# Patient Record
Sex: Male | Born: 1985 | Hispanic: Yes | Marital: Single | Smoking: Current every day smoker
Health system: Southern US, Community
[De-identification: ages and names within clinical notes are randomized; demographics above are authoritative.]

## PROBLEM LIST (undated history)

## (undated) HISTORY — PX: OTHER SURGICAL HISTORY: SHX169

---

## 2014-01-21 ENCOUNTER — Encounter (HOSPITAL_COMMUNITY): Payer: Self-pay | Admitting: Emergency Medicine

## 2014-01-21 ENCOUNTER — Emergency Department (HOSPITAL_COMMUNITY): Payer: Self-pay

## 2014-01-21 ENCOUNTER — Emergency Department (HOSPITAL_COMMUNITY)
Admission: EM | Admit: 2014-01-21 | Discharge: 2014-01-22 | Disposition: A | Discharge status: Home / Self Care | Payer: Self-pay | Attending: Emergency Medicine | Admitting: Emergency Medicine

## 2014-01-21 DIAGNOSIS — R0989 Other specified symptoms and signs involving the circulatory and respiratory systems: Secondary | ICD-10-CM

## 2014-01-21 DIAGNOSIS — F17200 Nicotine dependence, unspecified, uncomplicated: Secondary | ICD-10-CM | POA: Insufficient documentation

## 2014-01-21 DIAGNOSIS — R6889 Other general symptoms and signs: Secondary | ICD-10-CM | POA: Insufficient documentation

## 2014-01-21 NOTE — ED Provider Notes (Signed)
CSN: [id number]     Arrival date & time 01/21/14  2246 History   First MD Initiated Contact with Patient 01/21/14 2315    This chart was scribed for non-physician practitioner working with Loren Racer, MD by Arlan Organ, ED Scribe. This patient was seen in room WTR8/WTR8 and the patient's care was started at 11:57 PM.    Chief Complaint  Patient presents with  . Swallowed Foreign Body   HPI  HPI Comments: Nicholas Butler is a 28 y.o. male who presents to the Emergency Department complaining of swallowed foreign body ingested just prior to arrival. Pt states he opened a beer bottle with his mouth earlier this evening. Denies removing the cap from his mouth after opening the bottle. He states he then laughed and accidentally swallowed the bottle cap. States he can feel the cap in his throat at this time. Pt has eaten soup broth without any difficulty after incident. He denies any drooling or vomiting. He has no pertinent past medical history. No other concerns this visit.  History reviewed. No pertinent past medical history. Past Surgical History  Procedure Laterality Date  . Arm surgery     History reviewed. No pertinent family history. History  Substance Use Topics  . Smoking status: Current Every Day Smoker  . Smokeless tobacco: Not on file  . Alcohol Use: Yes    Review of Systems  Constitutional: Negative for fever and chills.  HENT: Negative for congestion.   Eyes: Negative for redness.  Respiratory: Negative for cough.   Gastrointestinal: Negative for vomiting.  Skin: Negative for rash.  Psychiatric/Behavioral: Negative for confusion.      Allergies  Review of patient's allergies indicates no known allergies.  Home Medications   Prior to Admission medications   Not on File   Triage Vitals: BP 114/60  Pulse 61  Temp(Src) 98 F (36.7 C) (Oral)  Resp 20  SpO2 100%   Physical Exam  Nursing note and vitals reviewed. Constitutional: He is oriented to person,  place, and time. He appears well-developed and well-nourished. No distress.  Nontoxic/nonseptic appearing. Patient in no visible or audible discomfort. Sleeping on initial presentation to exam room.  HENT:  Head: Normocephalic and atraumatic.  Mouth/Throat: Oropharynx is clear and moist. No oropharyngeal exudate.  Oropharynx clear. Patient tolerating secretions without difficulty.  Eyes: Conjunctivae and EOM are normal. No scleral icterus.  Neck: Normal range of motion. Neck supple.  No stridor.  Cardiovascular: Normal rate, regular rhythm and normal heart sounds.   Pulmonary/Chest: Effort normal and breath sounds normal. No stridor. No respiratory distress. He has no wheezes. He has no rales.  No tachypnea, dyspnea, or wheezing. Patient speaking in full sentences.  Abdominal: He exhibits no distension.  Musculoskeletal: Normal range of motion.  Neurological: He is alert and oriented to person, place, and time.  Skin: Skin is warm and dry. No rash noted. He is not diaphoretic. No erythema. No pallor.  Psychiatric: He has a normal mood and affect. His behavior is normal.    ED Course  Procedures (including critical care time)  DIAGNOSTIC STUDIES: Oxygen Saturation is 100% on RA, Normal by my interpretation.    COORDINATION OF CARE: 12:00 AM-Discussed treatment plan with pt at bedside and pt agreed to plan.     Labs Review Labs Reviewed - No data to display  Imaging Review Dg Neck Soft Tissue  01/21/2014   CLINICAL DATA:  Swallowed a cap of a beer bottle by accident, throat pain since  EXAM:  NECK SOFT TISSUES - 1+ VIEW  COMPARISON:  None  FINDINGS: Prevertebral soft tissues normal thickness.  No radiopaque foreign bodies identified from the oral cavity to the mid esophagus.  Upper lungs clear.  Osseous structures unremarkable.  IMPRESSION: No radiopaque foreign body identified.   Electronically Signed   By: Ulyses SouthwardMark  Boles M.D.   On: 01/21/2014 23:39   Dg Chest 1 View  01/21/2014    CLINICAL DATA:  Assess for foreign body. Patient describes accidentally swallowing [place]ona beer bottle cap. Throat pain.  EXAM: CHEST - 1 VIEW  COMPARISON:  None.  FINDINGS: The lungs are well-aerated and clear. There is no evidence of focal opacification, pleural effusion or pneumothorax.  The cardiomediastinal silhouette is within normal limits. No acute osseous abnormalities are seen.  No radiopaque foreign bodies are seen.  IMPRESSION: No radiopaque foreign bodies seen. No acute cardiopulmonary process identified.   Electronically Signed   By: Roanna RaiderJeffery  Chang M.D.   On: 01/21/2014 23:44   Dg Abd 1 View  01/21/2014   CLINICAL DATA:  Assess for foreign body. Patient describes accidentally swallowing [place]ona beer bottle cap.  EXAM: ABDOMEN - 1 VIEW  COMPARISON:  None.  FINDINGS: The visualized bowel gas pattern is unremarkable. Scattered air and stool filled loops of colon are seen; no abnormal dilatation of small bowel loops is seen to suggest small bowel obstruction. No free intra-abdominal air is identified, though evaluation for free air is limited on a single supine view.  No radiopaque foreign bodies are seen.  The visualized osseous structures are within normal limits; the sacroiliac joints are unremarkable in appearance. The visualized lung bases are essentially clear.  IMPRESSION: No radiopaque foreign bodies seen; unremarkable bowel gas pattern. No free intra-abdominal air identified.   Electronically Signed   By: Roanna RaiderJeffery  Chang M.D.   On: 01/21/2014 23:46     EKG Interpretation None      MDM   Final diagnoses:  Foreign body sensation in throat    28 year old male presents to the emergency department for the sensation of a foreign body in his throat. Patient states that he opened a Corona bottle with his mouth and was playing with a metal bottle cap when he laughed and swallowed it. Patient has been able to drink soup broth at home prior to arrival. He denies drooling or inability to  swallow. He further denies shortness of breath and wheezing. Patient protecting his airway. Oropharynx clear. He is tolerating his secretions without difficulty. No stridor. Imaging today shows no evidence of foreign body in the neck, chest, or abdomen. Doubt swallowed foreign body; however, will refer to ENT if foreign body sensation persists. Return precautions provided and patient agreeable to plan with no unaddressed concerns.  I personally performed the services described in this documentation, which was scribed in my presence. The recorded information has been reviewed and is accurate.   Filed Vitals:   01/21/14 2256  BP: 114/60  Pulse: 61  Temp: 98 F (36.7 C)  TempSrc: Oral  Resp: 20  SpO2: 100%     Antony MaduraKelly Shirell Struthers, PA-C 01/22/14 0008

## 2014-01-21 NOTE — ED Notes (Signed)
Pt states he was playing basketball and had a beer cap in his mouth  Pt states when he laughed he swallowed it   Pt states he is having pain in his throat  No acute resp distress noted at this time

## 2014-01-22 NOTE — ED Provider Notes (Signed)
Medical screening examination/treatment/procedure(s) were performed by non-physician practitioner and as supervising physician I was immediately available for consultation/collaboration.   EKG Interpretation None        Loren Racer, MD 01/22/14 [id number]

## 2014-01-22 NOTE — Discharge Instructions (Signed)
Your imaging today showed no bottle cap in your esophagus, stomach, or intestines. Recommend you followup with an ear nose and throat specialist if symptoms persist.  Swallowed Foreign Body, Adult You have swallowed an object (foreign body). Once the foreign body has passed through the food tube (esophagus), which leads from the mouth to the stomach, it will usually continue through the body without problems. This is because the point where the esophagus enters into the stomach is the narrowest place through which the foreign body must pass. Sometimes the foreign body gets stuck. The most common type of foreign body obstruction in adults is food impaction. Many times, bones from fish or meat products may become lodged in the esophagus or injure the throat on the way down. When there is an object that obstructs the esophagus, the most obvious symptoms are pain and the inability to swallow normally. In some cases, foreign bodies that can be life threatening are swallowed. Examples of these are certain medications and illicit drugs. Often in these instances, patients are afraid of telling what they swallowed. However, it is extremely important to tell the emergency caregiver what was swallowed because life-saving treatment may be needed.  X-ray exams may be taken to find the location of the foreign body. However, some objects do not show up well or may be too small to be seen on an X-ray image. If the foreign body is too large or too sharp, it may be too dangerous to allow it to pass on its own. You may need to see a caregiver who specializes in the digestive system (gastroenterologist). In a few cases, a specialist may need to remove the object using a method called "endoscopy". This involves passing a thin, soft, flexible tube into the food pipe to locate and remove the object. Follow up with your primary doctor or the referral you were given by the emergency caregiver. HOME CARE INSTRUCTIONS   If your  caregiver says it is safe for you to eat, then only have liquids and soft foods until your symptoms improve.  Once you are eating normally:  Cut food into small pieces.  Remove small bones from food.  Remove large seeds and pits from fruit.  Chew your food well.  Do not talk, laugh, or engage in physical activity while eating or swallowing. SEEK MEDICAL CARE IF:  You develop worsening shortness of breath, uncontrollable coughing, chest pains or high fever, greater than 102 F (38.9 C).  You are unable to eat or drink or you feel that food is getting stuck in your throat.  You have choking symptoms or cannot stop drooling.  You develop abdominal pain, vomiting (especially of blood), or rectal bleeding. MAKE SURE YOU:   Understand these instructions.  Will watch your condition.  Will get help right away if you are not doing well or get worse. Document Released: 01/31/2010 Document Revised: 11/05/2011 Document Reviewed: 01/31/2010 [facility] Patient Information 2014 [place], [place].

## 2014-03-18 ENCOUNTER — Emergency Department (HOSPITAL_COMMUNITY)
Admission: EM | Admit: 2014-03-18 | Discharge: 2014-03-18 | Disposition: A | Discharge status: Home / Self Care | Payer: Self-pay | Attending: Emergency Medicine | Admitting: Emergency Medicine

## 2014-03-18 ENCOUNTER — Emergency Department (HOSPITAL_COMMUNITY): Payer: Self-pay

## 2014-03-18 ENCOUNTER — Encounter (HOSPITAL_COMMUNITY): Payer: Self-pay | Admitting: Emergency Medicine

## 2014-03-18 DIAGNOSIS — F17200 Nicotine dependence, unspecified, uncomplicated: Secondary | ICD-10-CM | POA: Insufficient documentation

## 2014-03-18 DIAGNOSIS — R1013 Epigastric pain: Secondary | ICD-10-CM | POA: Insufficient documentation

## 2014-03-18 DIAGNOSIS — R1011 Right upper quadrant pain: Secondary | ICD-10-CM | POA: Insufficient documentation

## 2014-03-18 LAB — COMPREHENSIVE METABOLIC PANEL
ALT: 18 U/L (ref 0–53)
AST: 23 U/L (ref 0–37)
Albumin: 3.8 g/dL (ref 3.5–5.2)
Alkaline Phosphatase: 67 U/L (ref 39–117)
Anion gap: 10 (ref 5–15)
BUN: 10 mg/dL (ref 6–23)
CALCIUM: 9.3 mg/dL (ref 8.4–10.5)
CO2: 26 meq/L (ref 19–32)
Chloride: 102 mEq/L (ref 96–112)
Creatinine, Ser: 0.71 mg/dL (ref 0.50–1.35)
GFR calc Af Amer: >90 mL/min (ref >90)
GFR calc non Af Amer: >90 mL/min (ref >90)
Glucose, Bld: 95 mg/dL (ref 70–99)
Potassium: 4.5 mEq/L (ref 3.7–5.3)
Sodium: 138 mEq/L (ref 137–147)
Total Bilirubin: 0.4 mg/dL (ref 0.3–1.2)
Total Protein: 7.1 g/dL (ref 6.0–8.3)

## 2014-03-18 LAB — CBC WITH DIFFERENTIAL/PLATELET
BASOS ABS: 0 10*3/uL (ref 0.0–0.1)
BASOS PCT: 0 % (ref 0–1)
EOS PCT: 2 % (ref 0–5)
Eosinophils Absolute: 0.1 10*3/uL (ref 0.0–0.7)
HEMATOCRIT: 41.6 % (ref 39.0–52.0)
Hemoglobin: 13.7 g/dL (ref 13.0–17.0)
Lymphocytes Relative: 31 % (ref 12–46)
Lymphs Abs: 2.3 10*3/uL (ref 0.7–4.0)
MCH: 30.8 pg (ref 26.0–34.0)
MCHC: 32.9 g/dL (ref 30.0–36.0)
MCV: 93.5 fL (ref 78.0–100.0)
MONO ABS: 0.8 10*3/uL (ref 0.1–1.0)
Monocytes Relative: 11 % (ref 3–12)
Neutro Abs: 4 10*3/uL (ref 1.7–7.7)
Neutrophils Relative %: 56 % (ref 43–77)
Platelets: 194 10*3/uL (ref 150–400)
RBC: 4.45 MIL/uL (ref 4.22–5.81)
RDW: 12.1 % (ref 11.5–15.5)
WBC: 7.3 10*3/uL (ref 4.0–10.5)

## 2014-03-18 LAB — URINALYSIS, ROUTINE W REFLEX MICROSCOPIC
Bilirubin Urine: NEGATIVE
GLUCOSE, UA: NEGATIVE mg/dL
Hgb urine dipstick: NEGATIVE
Ketones, ur: NEGATIVE mg/dL
Leukocytes, UA: NEGATIVE
Nitrite: NEGATIVE
Protein, ur: NEGATIVE mg/dL
Specific Gravity, Urine: 1.012 (ref 1.005–1.030)
UROBILINOGEN UA: 1 mg/dL (ref 0.0–1.0)
pH: 7 (ref 5.0–8.0)

## 2014-03-18 LAB — LIPASE, BLOOD: LIPASE: 21 U/L (ref 11–59)

## 2014-03-18 MED ORDER — OMEPRAZOLE 20 MG PO CPDR: 20.0000 mg | DELAYED_RELEASE_CAPSULE | Freq: Every day | ORAL | Status: AC

## 2014-03-18 MED ORDER — DICYCLOMINE HCL 10 MG/ML IM SOLN
20.0000 mg | Freq: Once | INTRAMUSCULAR | Status: AC
  Administered 2014-03-18: 20 mg via INTRAMUSCULAR
  Filled 2014-03-18: qty 2

## 2014-03-18 MED ORDER — GI COCKTAIL ~~LOC~~
30.0000 mL | Freq: Once | ORAL | Status: AC
  Administered 2014-03-18: 30 mL via ORAL
  Filled 2014-03-18: qty 30

## 2014-03-18 MED ORDER — MORPHINE SULFATE 4 MG/ML IJ SOLN
4.0000 mg | Freq: Once | INTRAMUSCULAR | Status: AC
  Administered 2014-03-18: 4 mg via INTRAVENOUS
  Filled 2014-03-18: qty 1

## 2014-03-18 MED ORDER — ONDANSETRON HCL 4 MG/2ML IJ SOLN
4.0000 mg | Freq: Once | INTRAMUSCULAR | Status: AC
  Administered 2014-03-18: 4 mg via INTRAVENOUS
  Filled 2014-03-18: qty 2

## 2014-03-18 NOTE — ED Notes (Signed)
Pt with abdominal pain x 2 days.  No n/v/d.  No fever.  Pain in mid abdomen.  No trouble with urination.

## 2014-03-18 NOTE — ED Provider Notes (Signed)
CSN: [id number]     Arrival date & time 03/18/14  1459 History   First MD Initiated Contact with Patient 03/18/14 1653     Chief Complaint  Patient presents with  . Abdominal Pain     (Consider location/radiation/quality/duration/timing/severity/associated sxs/prior Treatment) HPI Comments: Patient presents today with a chief complaint of RUQ and epigastric abdominal pain.  He reports that the pain has been intermittent for the past 2 days.  Pain worse after eating, particularly spicy food.  Pain does not radiate.  Patient denies fever, chills, nausea, vomiting, or diarrhea.  Denies chest pain, cough, or SOB.  Denies urinary symptoms.  Denies flank pain.   He has not taken anything for his symptoms prior to arrival.  He denies any prior history of Abdominal Surgeries.  No known history of Gallstones or Pancreatitis.  He does report that he drinks alcohol regularly and that he also eats a lot of spicy food.    Patient is a 28 y.o. male presenting with abdominal pain. The history is provided by the patient.  Abdominal Pain   History reviewed. No pertinent past medical history. Past Surgical History  Procedure Laterality Date  . Arm surgery     History reviewed. No pertinent family history. History  Substance Use Topics  . Smoking status: Current Every Day Smoker  . Smokeless tobacco: Not on file  . Alcohol Use: Yes    Review of Systems  Gastrointestinal: Positive for abdominal pain.  All other systems reviewed and are negative.     Allergies  Review of patient's allergies indicates no known allergies.  Home Medications   Prior to Admission medications   Not on File   BP 126/62  Pulse 72  Temp(Src) 98.3 F (36.8 C) (Oral)  Resp 20  SpO2 99% Physical Exam  Nursing note and vitals reviewed. Constitutional: He appears well-developed and well-nourished.  HENT:  Head: Normocephalic and atraumatic.  Mouth/Throat: Oropharynx is clear and moist.  Neck: Normal range of  motion. Neck supple.  Cardiovascular: Normal rate, regular rhythm and normal heart sounds.   Pulmonary/Chest: Effort normal and breath sounds normal.  Abdominal: Soft. Bowel sounds are normal. He exhibits no distension and no mass. There is tenderness in the right upper quadrant. There is no rebound, no guarding and no tenderness at McBurney's point.  Neurological: He is alert.  Skin: Skin is warm and dry.  Psychiatric: He has a normal mood and affect.    ED Course  Procedures (including critical care time) Labs Review Labs Reviewed  URINALYSIS, ROUTINE W REFLEX MICROSCOPIC - Abnormal; Notable for the following:    APPearance CLOUDY (*)    All other components within normal limits  CBC WITH DIFFERENTIAL  COMPREHENSIVE METABOLIC PANEL  LIPASE, BLOOD    Imaging Review [place] Abdomen Complete  03/18/2014   CLINICAL DATA:  Right upper quadrant pain for 2 days.  EXAM: ULTRASOUND ABDOMEN COMPLETE  COMPARISON:  None.  FINDINGS: Gallbladder:  No gallstones or wall thickening visualized. No sonographic Murphy sign noted. 0.3 cm polyp is incidentally noted.  Common bile duct:  Diameter: 0.3 cm.  Liver:  No focal lesion identified. Within normal limits in parenchymal echogenicity.  IVC:  No abnormality visualized.  Pancreas:  Visualized portion unremarkable.  Spleen:  Size and appearance within normal limits.  Right Kidney:  Length: 12.3 cm. Echogenicity within normal limits. No mass or hydronephrosis visualized.  Left Kidney:  Length: 11.5 cm. Echogenicity within normal limits. No mass or hydronephrosis visualized.  Abdominal aorta:  No aneurysm visualized.  Other findings:  None.  IMPRESSION: Negative for gallstones or acute abnormality. 0.3 cm gallbladder polyp noted.   Electronically Signed   By: Drusilla Kanner M.D.   On: 03/18/2014 19:00     EKG Interpretation None     7:52 PM Patient reports improvement in pain. 8:39 PM Patient reports significant improvement in pain after GI cocktail.  HR  around 50 on monitor.  Patient denies chest pain or SOB. MDM   Final diagnoses:  None  Patient presents with RUQ abdominal pain.  Labs unremarkable.  No acute findings on abdominal ultrasound.  Pain improved after pain medication and GI cocktail.  Patient stable for discharge.  Patient instructed to take Prilosec and given referral to GI.  Return precautions given.      Santiago Glad, PA-C 03/20/14 2319

## 2014-03-18 NOTE — Discharge Instructions (Signed)
Dolor abdominal °(Abdominal Pain) °El dolor puede tener muchas causas. Normalmente la causa del dolor abdominal no es una enfermedad y mejorará sin tratamiento. Frecuentemente puede controlarse y tratarse en casa. Su médico le realizará un examen físico y posiblemente solicite análisis de sangre y radiografías para ayudar a determinar la gravedad de su dolor. Sin embargo, en muchos casos, debe transcurrir más tiempo antes de que se pueda encontrar una causa evidente del dolor. Antes de llegar a ese punto, es posible que su médico no sepa si necesita más pruebas o un tratamiento más profundo. °INSTRUCCIONES PARA EL CUIDADO EN EL HOGAR  °Esté atento al dolor para ver si hay cambios. Las siguientes indicaciones ayudarán a aliviar cualquier molestia que pueda sentir: °· Tome solo medicamentos de venta libre o recetados, según las indicaciones del médico. °· No tome laxantes a menos que se lo haya indicado su médico. °· Pruebe con una dieta líquida absoluta (caldo, té o agua) según se lo indique su médico. Introduzca gradualmente una dieta normal, según su tolerancia. °SOLICITE ATENCIÓN MÉDICA SI: °· Tiene dolor abdominal sin explicación. °· Tiene dolor abdominal relacionado con náuseas o diarrea. °· Tiene dolor cuando orina o defeca. °· Experimenta dolor abdominal que lo despierta de noche. °· Tiene dolor abdominal que empeora o mejora cuando come alimentos. °· Tiene dolor abdominal que empeora cuando come alimentos grasosos. °· Tiene fiebre. °SOLICITE ATENCIÓN MÉDICA DE INMEDIATO SI:  °· El dolor no desaparece en un plazo máximo de 2 horas. °· No deja de (vomitar). °· El dolor se siente solo en partes del abdomen, como el lado derecho o la parte inferior izquierda del abdomen. °· Evacúa materia fecal sanguinolenta o negra, de aspecto alquitranado. °ASEGÚRESE DE QUE: °· Comprende estas instrucciones. °· Controlará su afección. °· Recibirá ayuda de inmediato si no mejora o si empeora. °Document Released: 08/13/2005  Document Revised: 08/18/2013 °Exit[employer]® Patient Information ©2015 [employer]. This information is not intended to replace advice given to you by your health care provider. Make sure you discuss any questions you have with your health care provider. ° °

## 2014-03-18 NOTE — ED Notes (Signed)
US at bedside

## 2014-03-18 NOTE — ED Notes (Signed)
Pt alert and oriented x4. Respirations even and unlabored, bilateral symmetrical rise and fall of chest. Skin warm and dry. In no acute distress. Denies needs.   

## 2014-03-18 NOTE — ED Notes (Signed)
Pt reports pain is coming back. PA made aware

## 2014-03-21 NOTE — ED Provider Notes (Signed)
Medical screening examination/treatment/procedure(s) were performed by non-physician practitioner and as supervising physician I was immediately available for consultation/collaboration.   EKG Interpretation   Date/Time:  Thursday March 18 2014 17:47:58 EDT Ventricular Rate:  37 PR Interval:  188 QRS Duration: 95 QT Interval:  445 QTC Calculation: 349 R Axis:   86 Text Interpretation:  Sinus bradycardia ST elev, probable normal early  repol pattern Confirmed by Wilkie AyeHORTON  MD, COURTNEY ([id number]) on 03/18/2014  5:53:57 PM       Toy BakerAnthony T Wil Slape, MD 03/21/14 1526

## 2014-11-01 ENCOUNTER — Encounter (HOSPITAL_COMMUNITY): Payer: Self-pay | Admitting: Emergency Medicine

## 2014-11-01 ENCOUNTER — Emergency Department (HOSPITAL_COMMUNITY)
Admission: EM | Admit: 2014-11-01 | Discharge: 2014-11-01 | Disposition: A | Discharge status: Home / Self Care | Payer: Self-pay | Attending: Emergency Medicine | Admitting: Emergency Medicine

## 2014-11-01 ENCOUNTER — Emergency Department (HOSPITAL_COMMUNITY): Payer: Self-pay

## 2014-11-01 DIAGNOSIS — Z720 Tobacco use: Secondary | ICD-10-CM | POA: Insufficient documentation

## 2014-11-01 DIAGNOSIS — S46911A Strain of unspecified muscle, fascia and tendon at shoulder and upper arm level, right arm, initial encounter: Secondary | ICD-10-CM

## 2014-11-01 DIAGNOSIS — Y9366 Activity, soccer: Secondary | ICD-10-CM | POA: Insufficient documentation

## 2014-11-01 DIAGNOSIS — Y998 Other external cause status: Secondary | ICD-10-CM | POA: Insufficient documentation

## 2014-11-01 DIAGNOSIS — S56911A Strain of unspecified muscles, fascia and tendons at forearm level, right arm, initial encounter: Secondary | ICD-10-CM | POA: Insufficient documentation

## 2014-11-01 DIAGNOSIS — W1839XA Other fall on same level, initial encounter: Secondary | ICD-10-CM | POA: Insufficient documentation

## 2014-11-01 DIAGNOSIS — Z79899 Other long term (current) drug therapy: Secondary | ICD-10-CM | POA: Insufficient documentation

## 2014-11-01 DIAGNOSIS — Y92322 Soccer field as the place of occurrence of the external cause: Secondary | ICD-10-CM | POA: Insufficient documentation

## 2014-11-01 MED ORDER — NAPROXEN 500 MG PO TABS: 500.0000 mg | ORAL_TABLET | Freq: Two times a day (BID) | ORAL | Status: AC

## 2014-11-01 NOTE — ED Notes (Signed)
Pt fell playing soccer yesterday, caught himself with his wrist, c/o right shoulder pain. Sensation intact.

## 2014-11-01 NOTE — ED Provider Notes (Signed)
CSN: [id number]     Arrival date & time 11/01/14  1812 History  This chart was scribed for non-physician practitioner, Antony MaduraKelly Loraine Freid, PA-C, working with Linwood DibblesJon Knapp, MD, by Ronney LionSuzanne Le, ED Scribe. This patient was seen in room WTR6/WTR6 and the patient's care was started at 8:11 PM.     No chief complaint on file.  The history is provided by the patient. No language interpreter was used.    HPI Comments: Nicholas Butler is a 29 y.o. male who is right-hand dominant who presents to the Emergency Department complaining of a right arm injury that occurred when patient was playing soccer yesterday and fell, catching himself with his right wrist. He complains of associated throbbing pain in his upper right forearm, near his medial elbow. He notes some pain in his right shoulder, but states it is not as severe as in his forearm. Patient states that movement exacerbates the pain. Patient has tried applying heat and has taken ibuprofen, with mild relief. He denies applying ice to the area. Patient works as a Designer, fashion/clothingroofer.  History reviewed. No pertinent past medical history. Past Surgical History  Procedure Laterality Date  . Arm surgery     No family history on file. History  Substance Use Topics  . Smoking status: Current Every Day Smoker  . Smokeless tobacco: Not on file  . Alcohol Use: Yes    Review of Systems  Musculoskeletal: Positive for myalgias and arthralgias.  All other systems reviewed and are negative.   Allergies  Review of patient's allergies indicates no known allergies.  Home Medications   Prior to Admission medications   Medication Sig Start Date End Date Taking? Authorizing Provider  ibuprofen (ADVIL,MOTRIN) 200 MG tablet Take 800 mg by mouth every 6 (six) hours as needed for moderate pain (pain).   Yes Historical Provider, MD  naproxen (NAPROSYN) 500 MG tablet Take 1 tablet (500 mg total) by mouth 2 (two) times daily. 11/01/14   Antony MaduraKelly Katerine Morua, PA-C  omeprazole (PRILOSEC) 20 MG capsule  Take 1 capsule (20 mg total) by mouth daily. Patient not taking: Reported on 11/01/2014 03/18/14   Heather Laisure, PA-C   BP 110/60 mmHg  Pulse 64  Temp(Src) 98.1 F (36.7 C) (Oral)  Resp 16  SpO2 99%   Physical Exam  Constitutional: He is oriented to person, place, and time. He appears well-developed and well-nourished. No distress.  Nontoxic/nonseptic appearing  HENT:  Head: Normocephalic and atraumatic.  Eyes: Conjunctivae and EOM are normal. No scleral icterus.  Neck: Normal range of motion.  Cardiovascular: Normal rate, regular rhythm and intact distal pulses.   Distal radial pulse 2+ in the right upper extremity  Pulmonary/Chest: Effort normal. No respiratory distress.  Musculoskeletal: Normal range of motion.       Right elbow: He exhibits swelling (mild). He exhibits normal range of motion, no deformity and no laceration. Tenderness found.       Arms: TTP to the volar aspect of the proximal R forearm. Mild TTP at site of muscle swelling; most c/w muscle strain of pronator teres. Normal PROM and AROM exhibited.  Neurological: He is alert and oriented to person, place, and time. He exhibits normal muscle tone. Coordination normal.  Sensation to light touch intact. Patient able to wiggle all fingers of right hand. Normal grip strength bilaterally. Strength against resistance 5/5 in all major muscle groups in bilateral upper extremities.  Skin: Skin is warm and dry. No rash noted. He is not diaphoretic. No erythema. No pallor.  Psychiatric: He has a normal mood and affect. His behavior is normal.  Nursing note and vitals reviewed.   ED Course  Procedures (including critical care time)  DIAGNOSTIC STUDIES: Oxygen Saturation is 99% on room air, normal by my interpretation.    COORDINATION OF CARE: 8:15 PM - Discussed negative XR findings with patient. Likely muscle strain. RICE protocol discussed. Will discharge with anti-inflammatory medication for pain. Will also give  orthopedic referral if needed.  Imaging Review Dg Shoulder Right  11/01/2014   CLINICAL DATA:  Injury on 10/30/2014 1 playing soccer. Pain radiating from the elbow to the right shoulder.  EXAM: RIGHT SHOULDER - 2+ VIEW  COMPARISON:  None.  FINDINGS: There is no evidence of fracture or dislocation. There is no evidence of arthropathy or other focal bone abnormality. Soft tissues are unremarkable.  IMPRESSION: Negative.   Electronically Signed   By: Burman Nieves M.D.   On: 11/01/2014 19:57   Dg Elbow Complete Right  11/01/2014   CLINICAL DATA:  Fall on the afternoon of 16109 playing soccer. Pain radiating from the posterior ulna to the right elbow and right shoulder.  EXAM: RIGHT ELBOW - COMPLETE 3+ VIEW  COMPARISON:  None.  FINDINGS: There is no evidence of fracture, dislocation, or joint effusion. There is no evidence of arthropathy or other focal bone abnormality. Soft tissues are unremarkable.  IMPRESSION: Negative.   Electronically Signed   By: Burman Nieves M.D.   On: 11/01/2014 19:56    MDM   Final diagnoses:  Elbow strain, right, initial encounter    29 year old nontoxic-appearing male presents to the emergency department for further evaluation of injury to right upper extremity. Patient is neurovascularly intact. No bony tenderness. Imaging negative for fracture or dislocation. Symptoms consistent with muscle strain/injury. Will manage conservatively with RICE and NSAIDs. Patient given referral to orthopedics should symptoms persist or worsen. Return precautions given and patient agreeable to plan with no unaddressed concerns. Patient discharged in good condition.  I personally performed the services described in this documentation, which was scribed in my presence. The recorded information has been reviewed and is accurate.   Filed Vitals:   11/01/14 1846 11/01/14 2036  BP:  110/60  Pulse: 73 64  Temp: 98.1 F (36.7 C) 98.1 F (36.7 C)  TempSrc: Oral Oral  Resp: 16 16   SpO2: 99% 99%     Antony Madura, PA-C 11/01/14 2218  Linwood Dibbles, MD 11/02/14 [id number]

## 2014-11-01 NOTE — Progress Notes (Signed)
  CARE MANAGEMENT ED NOTE 11/01/2014  Patient:  Nicholas Butler,Nicholas Butler   Account Number:  [id number]  Date Initiated:  11/01/2014  Documentation initiated by:  Radford PaxFERRERO,Jenesys Casseus  Subjective/Objective Assessment:   Patient presents to Ed post fall landing on his wrist     Subjective/Objective Assessment Detail:     Action/Plan:   Action/Plan Detail:   Anticipated DC Date:  11/01/2014     Status Recommendation to Physician:   Result of Recommendation:    Other ED Services  Consult Working Plan    DC Planning Services  Other  PCP issues    Choice offered to / List presented to:            Status of service:  Completed, signed off  ED Comments:   ED Comments Detail:  EDCM spoke to patient and his wife at bedside.  Patient confirms she does not have a pcp or [place].  EDCM provide patient with pamphlet to [facility], informed patient of services there and walk in times. EDCM also provided patient with list of pcps who accept self pay patients, list of discount pharmacies and websites needymeds.org and GoodRX.com for medication assistance, phone number to inquire about the orange card, phone number to inquire about [employer], phone number to inquire about the Affordable Care Act, financial resources in the community such as local churches, [employer], urban ministries, and dental assistance for uninsured patients. Patient thankful for resources.  Patient's wife agreeable to have information sent to the patient in the mail regarding the orange card. [facility] referral made.  No further EDCM needs at this time.

## 2014-11-01 NOTE — Discharge Instructions (Signed)
° ° °Elbow Contusion °An elbow contusion is a deep bruise of the elbow. Contusions are the result of an injury that caused bleeding under the skin. The contusion may turn blue, purple, or yellow. Minor injuries will give you a painless contusion, but more severe contusions may stay painful and swollen for a few weeks.  °CAUSES  °An elbow contusion comes from a direct force to that area, such as falling on the elbow. °SYMPTOMS  °· Swelling and redness of the elbow. °· Bruising of the elbow area. °· Tenderness or soreness of the elbow. °DIAGNOSIS  °You will have a physical exam and will be asked about your history. You may need an X-ray of your elbow to look for a broken bone (fracture).  °TREATMENT  °A sling or splint may be needed to support your injury. Resting, elevating, and applying cold compresses to the elbow area are often the best treatments for an elbow contusion. Over-the-counter medicines may also be recommended for pain control. °HOME CARE INSTRUCTIONS  °· Put ice on the injured area. °· Put ice in a plastic bag. °· Place a towel between your skin and the bag. °· Leave the ice on for 15-20 minutes, 03-04 times a day. °· Only take over-the-counter or prescription medicines for pain, discomfort, or fever as directed by your caregiver. °· Rest your injured elbow until the pain and swelling are better. °· Elevate your elbow to reduce swelling. °· Apply a compression wrap as directed by your caregiver. This can help reduce swelling and motion. You may remove the wrap for sleeping, showers, and baths. If your fingers become numb, cold, or blue, take the wrap off and reapply it more loosely. °· Use your elbow only as directed by your caregiver. You may be asked to do range of motion exercises. Do them as directed. °· See your caregiver as directed. It is very important to keep all follow-up appointments in order to avoid any long-term problems with your elbow, including chronic pain or inability to move your  elbow normally. °SEEK IMMEDIATE MEDICAL CARE IF:  °· You have increased redness, swelling, or pain in your elbow. °· Your swelling or pain is not relieved with medicines. °· You have swelling of the hand and fingers. °· You are unable to move your fingers or wrist. °· You begin to lose feeling in your hand or fingers. °· Your fingers or hand become cold or blue. °MAKE SURE YOU:  °· Understand these instructions. °· Will watch your condition. °· Will get help right away if you are not doing well or get worse. °Document Released: 07/22/2006 Document Revised: 11/05/2011 Document Reviewed: 06/29/2011 °ExitCare® Patient Information ©2015 [employer]. This information is not intended to replace advice given to you by your health care provider. Make sure you discuss any questions you have with your health care provider. ° ° °RICE: Routine Care for Injuries °The routine care of many injuries includes Rest, Ice, Compression, and Elevation (RICE). °HOME CARE INSTRUCTIONS °· Rest is needed to allow your body to heal. Routine activities can usually be resumed when comfortable. Injured tendons and bones can take up to 6 weeks to heal. Tendons are the cord-like structures that attach muscle to bone. °· Ice following an injury helps keep the swelling down and reduces pain. °¨ Put ice in a plastic bag. °¨ Place a towel between your skin and the bag. °¨ Leave the ice on for 15-20 minutes, 3-4 times a day, or as directed by your health care   provider. Do this while awake, for the first 24 to 48 hours. After that, continue as directed by your caregiver. °· Compression helps keep swelling down. It also gives support and helps with discomfort. If an elastic bandage has been applied, it should be removed and reapplied every 3 to 4 hours. It should not be applied tightly, but firmly enough to keep swelling down. Watch fingers or toes for swelling, bluish discoloration, coldness, numbness, or excessive pain. If any of these problems  occur, remove the bandage and reapply loosely. Contact your caregiver if these problems continue. °· Elevation helps reduce swelling and decreases pain. With extremities, such as the arms, hands, legs, and feet, the injured area should be placed near or above the level of the heart, if possible. °SEEK IMMEDIATE MEDICAL CARE IF: °· You have persistent pain and swelling. °· You develop redness, numbness, or unexpected weakness. °· Your symptoms are getting worse rather than improving after several days. °These symptoms may indicate that further evaluation or further X-rays are needed. Sometimes, X-rays may not show a small broken bone (fracture) until 1 week or 10 days later. Make a follow-up appointment with your caregiver. Ask when your X-ray results will be ready. Make sure you get your X-ray results. °Document Released: 11/25/2000 Document Revised: 08/18/2013 Document Reviewed: 01/12/2011 °ExitCare® Patient Information ©2015 [employer]. This information is not intended to replace advice given to you by your health care provider. Make sure you discuss any questions you have with your health care provider. ° ° °

## 2016-01-01 ENCOUNTER — Encounter (HOSPITAL_COMMUNITY): Payer: Self-pay

## 2016-01-01 ENCOUNTER — Emergency Department (HOSPITAL_COMMUNITY): Payer: Self-pay

## 2016-01-01 ENCOUNTER — Emergency Department (HOSPITAL_COMMUNITY)
Admission: EM | Admit: 2016-01-01 | Discharge: 2016-01-02 | Disposition: A | Discharge status: Home / Self Care | Payer: Self-pay | Attending: Emergency Medicine | Admitting: Emergency Medicine

## 2016-01-01 DIAGNOSIS — F17200 Nicotine dependence, unspecified, uncomplicated: Secondary | ICD-10-CM | POA: Insufficient documentation

## 2016-01-01 DIAGNOSIS — X500XXA Overexertion from strenuous movement or load, initial encounter: Secondary | ICD-10-CM | POA: Insufficient documentation

## 2016-01-01 DIAGNOSIS — Y9289 Other specified places as the place of occurrence of the external cause: Secondary | ICD-10-CM | POA: Insufficient documentation

## 2016-01-01 DIAGNOSIS — Z791 Long term (current) use of non-steroidal anti-inflammatories (NSAID): Secondary | ICD-10-CM | POA: Insufficient documentation

## 2016-01-01 DIAGNOSIS — S93402A Sprain of unspecified ligament of left ankle, initial encounter: Secondary | ICD-10-CM | POA: Insufficient documentation

## 2016-01-01 DIAGNOSIS — Y9366 Activity, soccer: Secondary | ICD-10-CM | POA: Insufficient documentation

## 2016-01-01 DIAGNOSIS — Y998 Other external cause status: Secondary | ICD-10-CM | POA: Insufficient documentation

## 2016-01-01 MED ORDER — HYDROCODONE-ACETAMINOPHEN 5-325 MG PO TABS: 1.0000 | ORAL_TABLET | Freq: Four times a day (QID) | ORAL | Status: AC | PRN

## 2016-01-01 MED ORDER — IBUPROFEN 200 MG PO TABS
600.0000 mg | ORAL_TABLET | Freq: Once | ORAL | Status: AC
  Administered 2016-01-02: 600 mg via ORAL
  Filled 2016-01-01: qty 3

## 2016-01-01 MED ORDER — IBUPROFEN 800 MG PO TABS: 800.0000 mg | ORAL_TABLET | Freq: Three times a day (TID) | ORAL | Status: AC

## 2016-01-01 MED ORDER — HYDROCODONE-ACETAMINOPHEN 5-325 MG PO TABS
1.0000 | ORAL_TABLET | Freq: Once | ORAL | Status: AC
  Administered 2016-01-02: 1 via ORAL
  Filled 2016-01-01: qty 1

## 2016-01-01 NOTE — ED Notes (Signed)
Patient c/o left ankle and foot pain.  Patient states that was playing soccer and was kicked on the medial ankle.  Patient states that pain has gotten worse since the incident today and the foot is swelling and painful.  Patient has movement of the toes and palpable pulses.  Patient rates pain 10/10.

## 2016-01-01 NOTE — Discharge Instructions (Signed)
Be sure to read and understand instructions below prior to leaving the hospital. If your symptoms persist without any improvement in 1-2 weeks it is recommended that you follow up with the orthopedist listed above. Use your pain medication as prescribed and do not operate heavy machinery while on pain medication. Note that your pain medication contains acetaminophen (Tylenol) & its is not reccommended that you use additional acetaminophen (Tylenol) while taking this medication.  Ankle Sprain  An ankle sprain is an injury to the ligaments that hold the ankle joint together. Your X-ray today showed no evidence of fracture, however keep all follow-up appointments with an orthopedic specialist to have follow-up X-rays, because fractures may not appear until 3 days after the acute injury.    TREATMENT  Rest, ice, elevation, and compression are the basic modes of treatment.    HOME CARE INSTRUCTIONS  Apply ice to the sore area for 15 to 20 minutes, 3 to 4 times per day. Do this while you are awake for the first 2 days. This can be stopped when the swelling goes away. Put the ice in a plastic bag and place a towel between the bag of ice and your skin.  Keep your leg elevated when possible to lessen swelling.  If your caregiver recommends crutches, use them as instructed for 1 week. Then, you may walk on your ankle weight bearing as tolerated.  You may take off your ankle stabilizer at night and to take a shower or bath. Wiggle your toes in the splint several times per day if you are able.  Do not drive a vehicle on pain medication. ACTIVITY:            - Weight bearing as tolerated            - Exercises should be limited to pain free range of motion            - Can start mobilization by tracing the alphabet with your foot in the air.       SEEK MEDICAL CARE IF:  You have an increase in bruising, swelling, or pain.  Your toes feel cold.  Pain relief is not achieved with medications.  EMERGENCY::  Your toes are numb or blue or you have severe pain.   MAKE SURE YOU:  Understand these instructions.  Will watch your condition.  Will get help right away if you are not doing well or get worse   COLD THERAPY DIRECTIONS:  Ice or gel packs can be used to reduce both pain and swelling. Ice is the most helpful within the first 24 to 48 hours after an injury or flareup from overusing a muscle or joint.  Ice is effective, has very few side effects, and is safe for most people to use.   If you expose your skin to cold temperatures for too long or without the proper protection, you can damage your skin or nerves. Watch for signs of skin damage due to cold.   HOME CARE INSTRUCTIONS  Follow these tips to use ice and cold packs safely.  Place a dry or damp towel between the ice and skin. A damp towel will cool the skin more quickly, so you may need to shorten the time that the ice is used.  For a more rapid response, add gentle compression to the ice.  Ice for no more than 10 to 20 minutes at a time. The bonier the area you are icing, the less time it will take  to get the benefits of ice.  Check your skin after 5 minutes to make sure there are no signs of a poor response to cold or skin damage.  Rest 20 minutes or more in between uses.  Once your skin is numb, you can end your treatment. You can test numbness by very lightly touching your skin. The touch should be so light that you do not see the skin dimple from the pressure of your fingertip. When using ice, most people will feel these normal sensations in this order: cold, burning, aching, and numbness.

## 2016-01-01 NOTE — ED Provider Notes (Signed)
CSN: [id number]     Arrival date & time 01/01/16  2205 History   By signing my name below, I, Nicholas Butler, attest that this documentation has been prepared under the direction and in the presence of [facility] Campus[facility]ime Mikki Ziff PA-C.  Electronically Signed: Arlan OrganAshley Butler, ED Scribe. 01/01/2016. 11:24 PM.   Chief Complaint  Patient presents with  . Foot Injury   The history is provided by the patient. No language interpreter was used.    HPI Comments: Nicholas Butler is a 30 y.o. male with no pertinent past medical history who presents to the Emergency Department complaining of constant, ongoing L ankle pain and L foot pain with associated swelling onset a few hours prior to arrival. Pain is described as throbbing. Pt states he was playing soccer earlier this afternoon when he was kicked in his ankle causing his ankle to invert. No head trauma or LOC. Discomfort to ankle and foot exacerbated with ambulation, movement, and pressure to area. No alleviating factors at this time. No recent fever, chills, nausea, or vomiting. No weakness, loss of sensation, or numbness. No known allergies to medications.  PCP: No PCP Per Patient    History reviewed. No pertinent past medical history. Past Surgical History  Procedure Laterality Date  . Arm surgery     No family history on file. Social History  Substance Use Topics  . Smoking status: Current Every Day Smoker  . Smokeless tobacco: None  . Alcohol Use: Yes     Comment: socially    Review of Systems  Constitutional: Negative for fever.  Musculoskeletal: Positive for joint swelling and arthralgias.  Skin: Negative for color change.  Neurological: Negative for weakness, numbness and headaches.      Allergies  Review of patient's allergies indicates no known allergies.  Home Medications   Prior to Admission medications   Medication Sig Start Date End Date Taking? Authorizing Provider  HYDROcodone-acetaminophen (NORCO/VICODIN) 5-325 MG tablet Take 1 tablet  by mouth every 6 (six) hours as needed for severe pain. 01/01/16   [name] [name], PA-C  ibuprofen (ADVIL,MOTRIN) 800 MG tablet Take 1 tablet (800 mg total) by mouth 3 (three) times daily. 01/01/16   [name], PA-C  naproxen (NAPROSYN) 500 MG tablet Take 1 tablet (500 mg total) by mouth 2 (two) times daily. 11/01/14   [name], PA-C  omeprazole (PRILOSEC) 20 MG capsule Take 1 capsule (20 mg total) by mouth daily. Patient not taking: Reported on 11/01/2014 03/18/14   [name], PA-C   Triage Vitals: BP 122/78 mmHg  Pulse 56  Temp(Src) 97.9 F (36.6 C) (Oral)  Resp 20  SpO2 100%   Physical Exam  Constitutional: He is oriented to person, place, and time. He appears well-developed and well-nourished.  HENT:  Head: Normocephalic and atraumatic.  Cardiovascular: Normal rate, regular rhythm and normal heart sounds.   No murmur heard. Pulmonary/Chest: Effort normal and breath sounds normal. No respiratory distress. He has no wheezes. He has no rales.  Abdominal: Soft. Bowel sounds are normal. He exhibits no distension. There is no tenderness.  Musculoskeletal:  Left ankle with decreased ROM 2/2 pain. No erythema, ecchymosis, or deformity appreciated. TTP of lateral malleolus and ATFL area but no TTP or swelling of fore foot or calf. No break in skin. No warmth. Achilles intact. Good pedal pulse and cap refill of all toes. Wiggling toes without difficulty. Sensation grossly intact.  Neurological: He is alert and oriented to person, place, and time.  Psychiatric: He has  a normal mood and affect.  Nursing note and vitals reviewed.   ED Course  Procedures (including critical care time)  DIAGNOSTIC STUDIES: Oxygen Saturation is 100% on RA, Normal by my interpretation.    COORDINATION OF CARE: 11:13 PM- Will order imaging. Discussed treatment plan with pt at bedside and pt agreed to plan.     Labs Review Labs Reviewed - No data to display  Imaging Review Dg Ankle Complete  Left  01/01/2016  CLINICAL DATA:  30 year old male with left ankle injury. EXAM: LEFT ANKLE COMPLETE - 3+ VIEW COMPARISON:  None. FINDINGS: There is no evidence of fracture, dislocation, or joint effusion. There is no evidence of arthropathy or other focal bone abnormality. Soft tissues are unremarkable. IMPRESSION: Negative. Electronically Signed   By: Elgie Collard M.D.   On: 01/01/2016 22:47   I have personally reviewed and evaluated these images and lab results as part of my medical decision-making.   EKG Interpretation None      MDM   Final diagnoses:  Ankle sprain, left, initial encounter   Tc Nicholas Butler presents to ED after ankle injury earlier this afternoon. X-rays were obtained which were unremarkable. Exam findings consistent with ankle sprain. RICE and symptomatic home care instructions given. Crutches and ASO brace provided in ED. Ortho follow-up in 1-2 weeks if needed. Return precautions discussed and all questions answered.   I personally performed the services described in this documentation, which was scribed in my presence. The recorded information has been reviewed and is accurate.  [facility] Janace Decker, PA-C 01/01/16 2340  Raeford Razor, MD 01/05/16 1256

## 2016-01-02 NOTE — ED Notes (Signed)
Pt reports understanding of discharge information. No questions at time of discharge 

## 2016-06-29 IMAGING — CR DG SHOULDER 2+V*R*
3 series · 3 of 3 positions shown · non-contrast
Comparison: None.

CLINICAL DATA: Injury on 10/30/2014 1 playing soccer. Pain
radiating from the elbow to the right shoulder.

EXAM:
RIGHT SHOULDER - 2+ VIEW

[w shoulder external right]
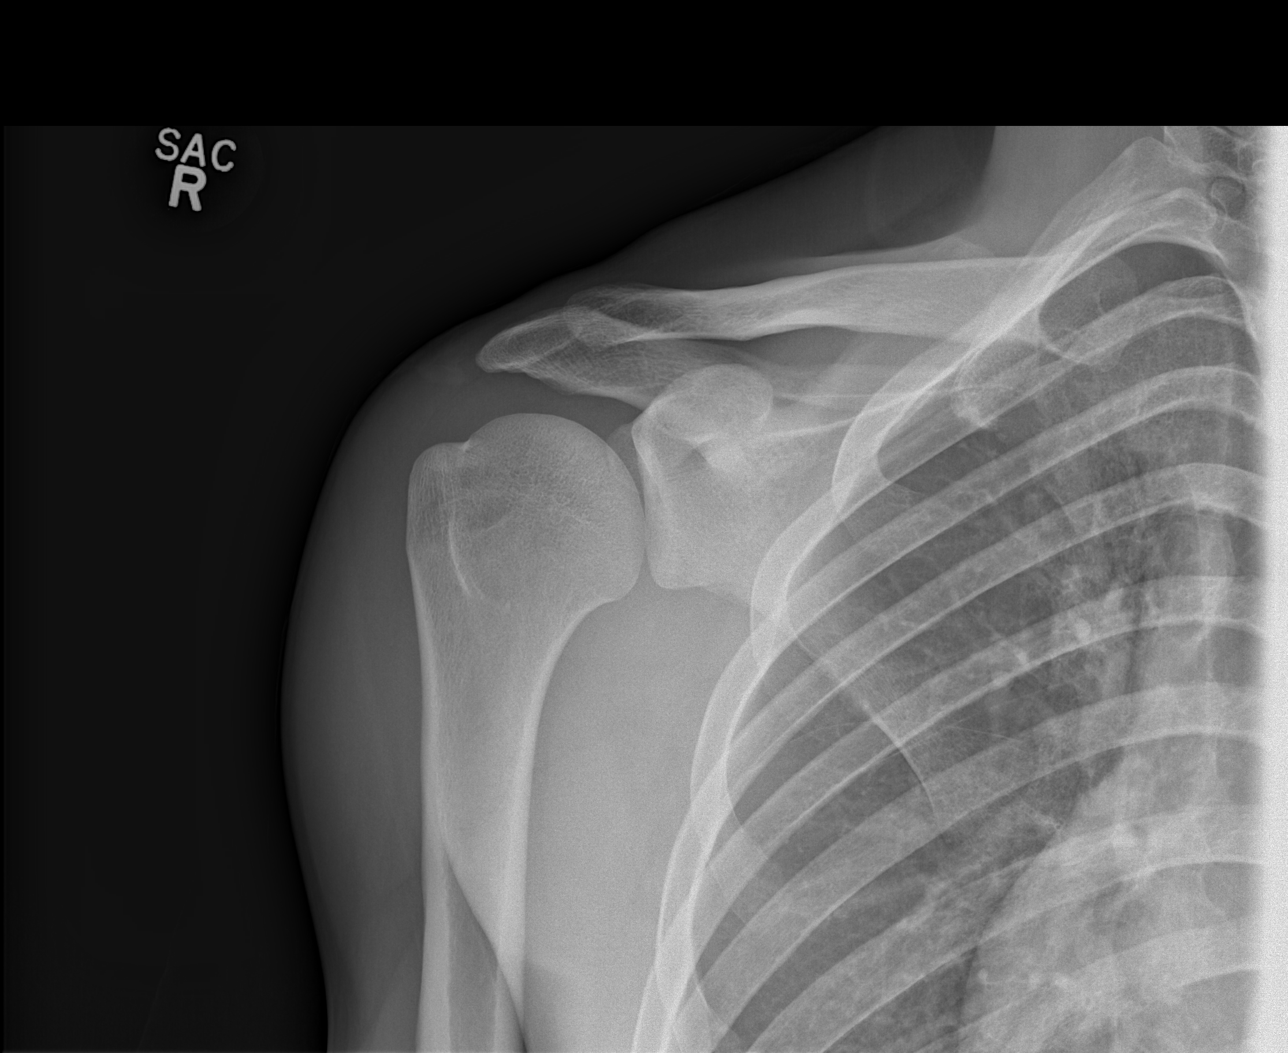

[w shoulder y-view right]
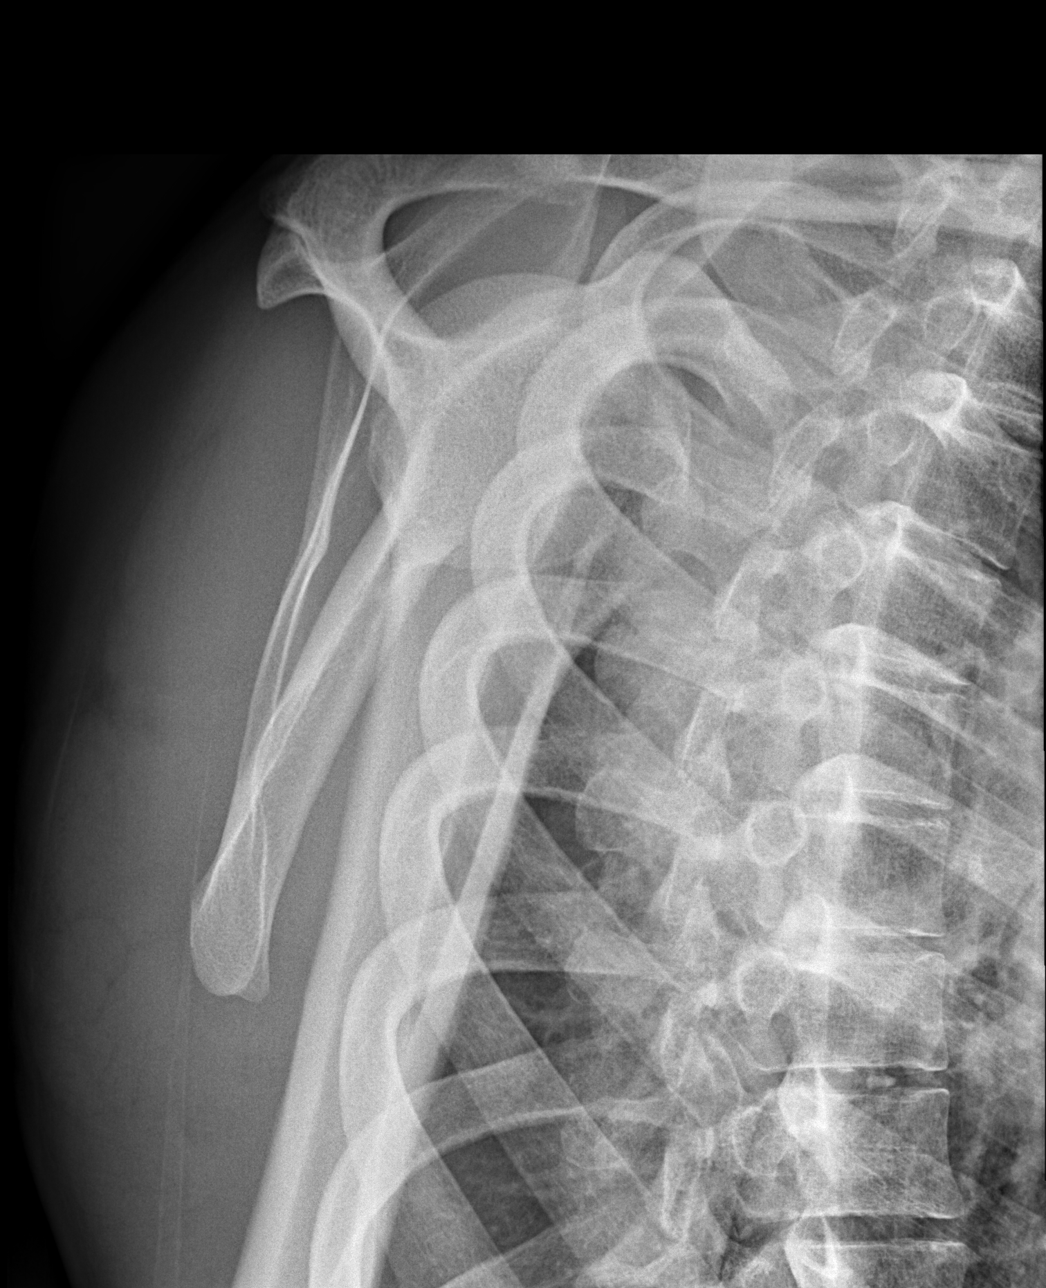

[x shoulder axillary right]
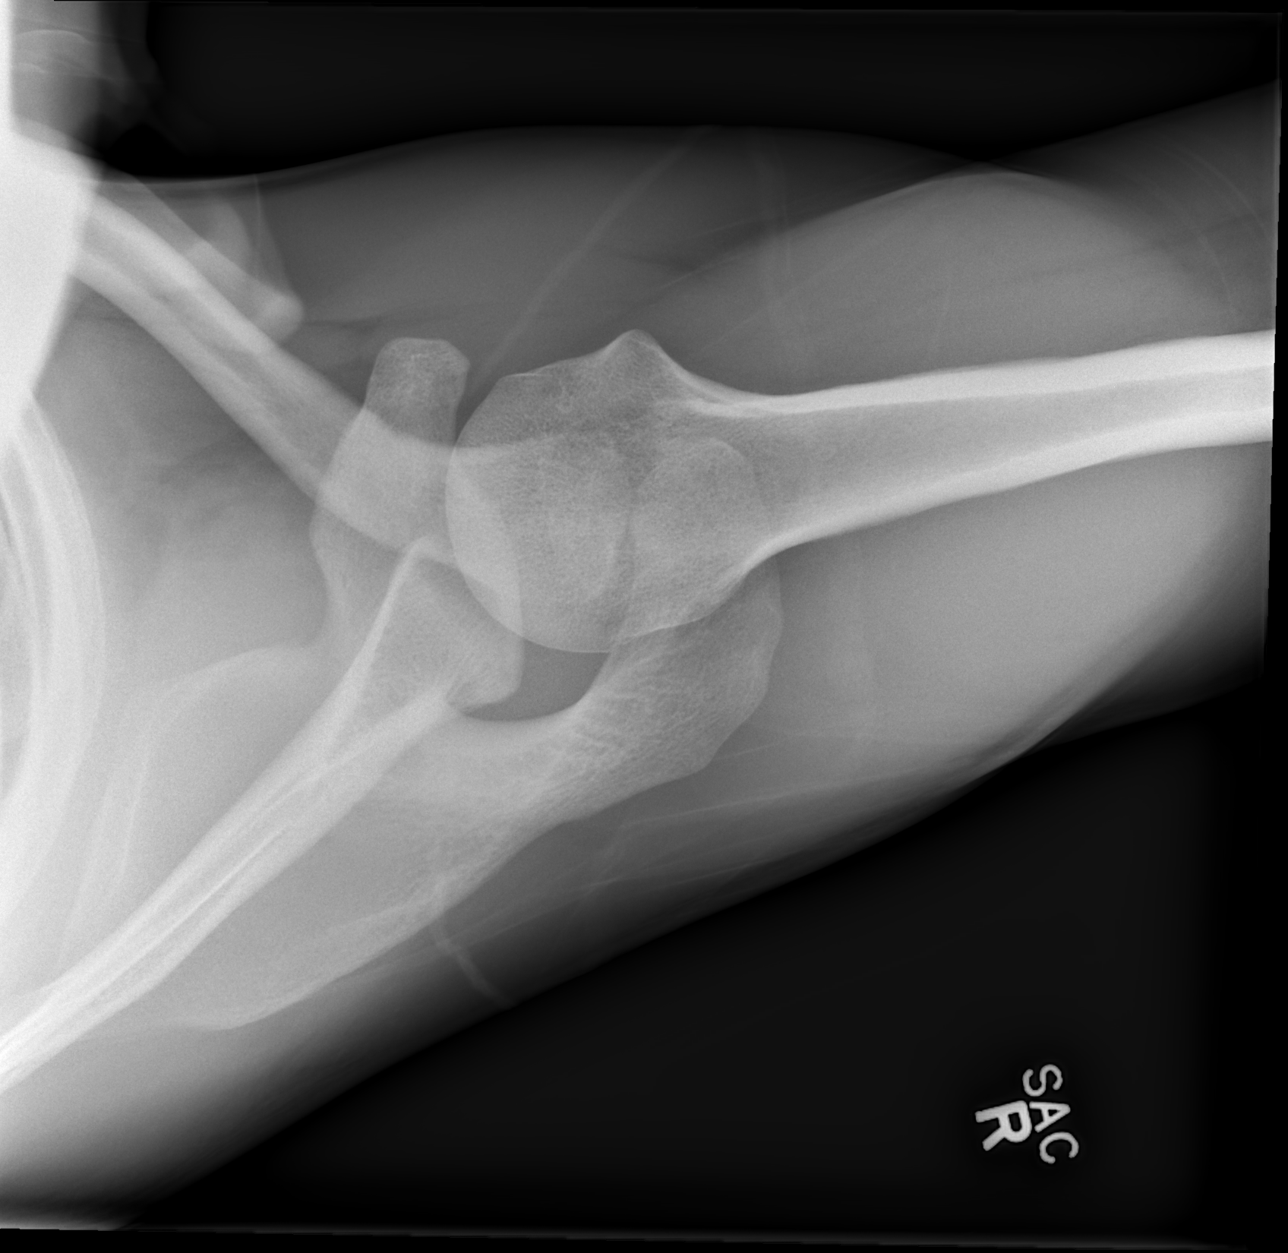

[3 of 3 positions shown; findings below may reference images not displayed]

FINDINGS: There is no evidence of fracture or dislocation. There is no
evidence of arthropathy or other focal bone abnormality. Soft
tissues are unremarkable.
IMPRESSION: Negative.

## 2017-08-29 IMAGING — CR DG ANKLE COMPLETE 3+V*L*
3 series · 3 of 3 positions shown · non-contrast
Comparison: None.

CLINICAL DATA: 30-year-old male with left ankle injury.

EXAM:
LEFT ANKLE COMPLETE - 3+ VIEW

[x ankle ap left]
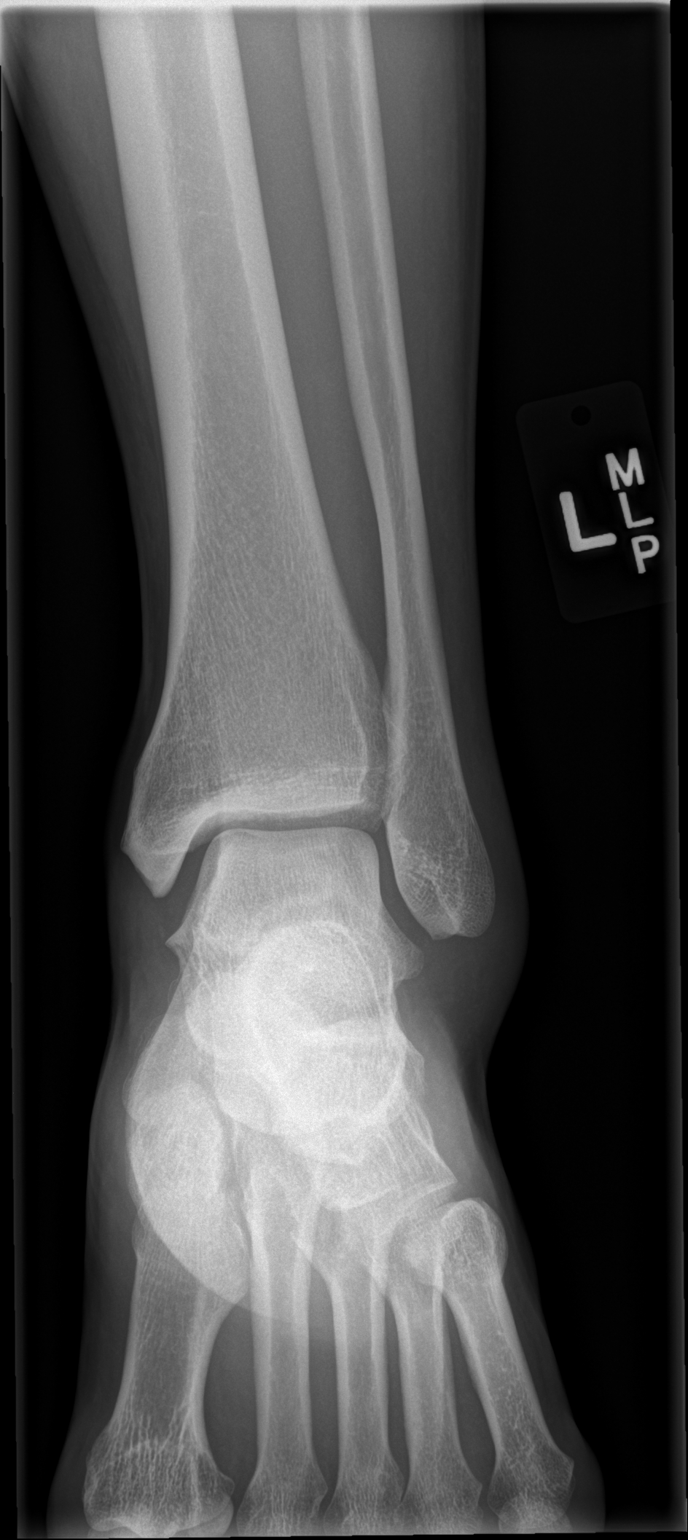

[x ankle obl left]
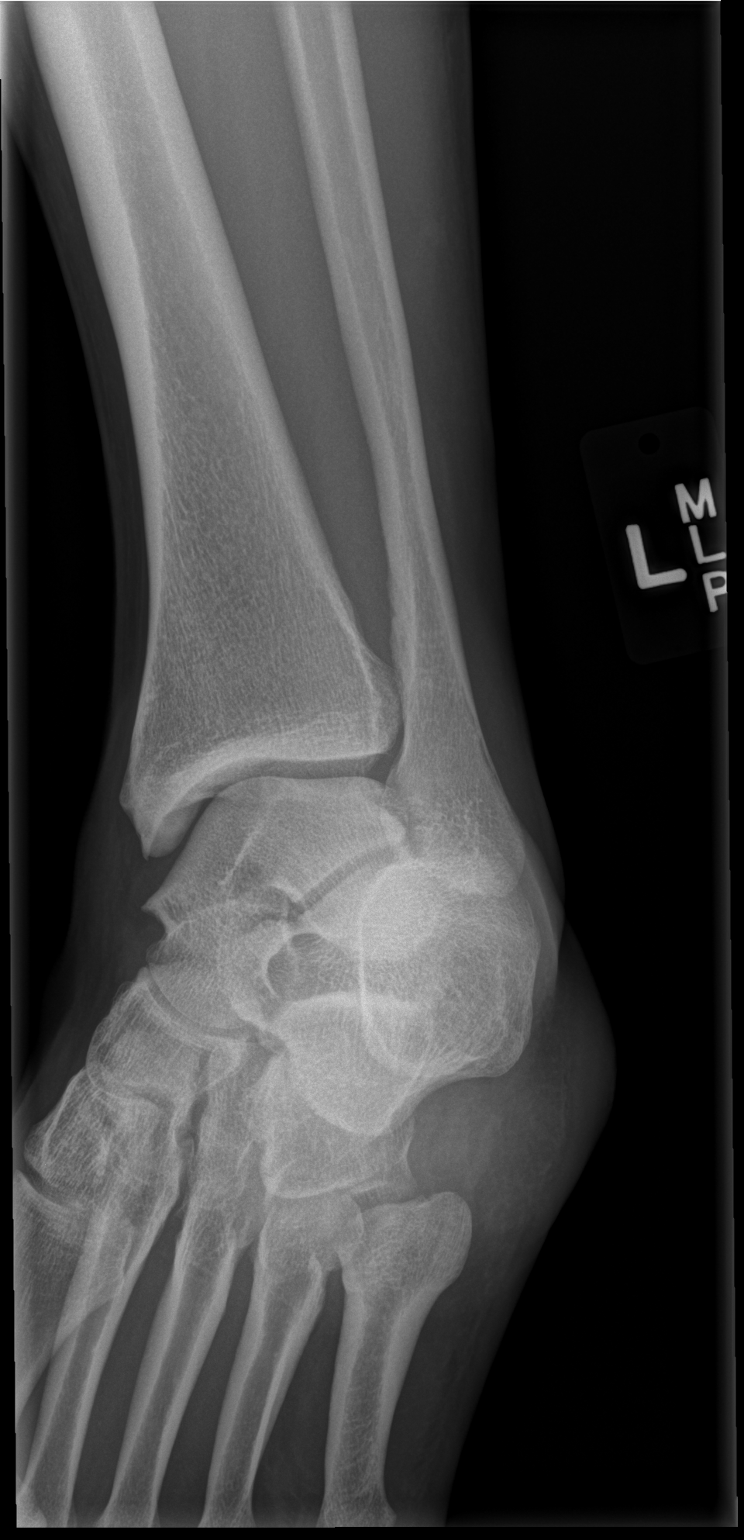

[x ankle lat left]
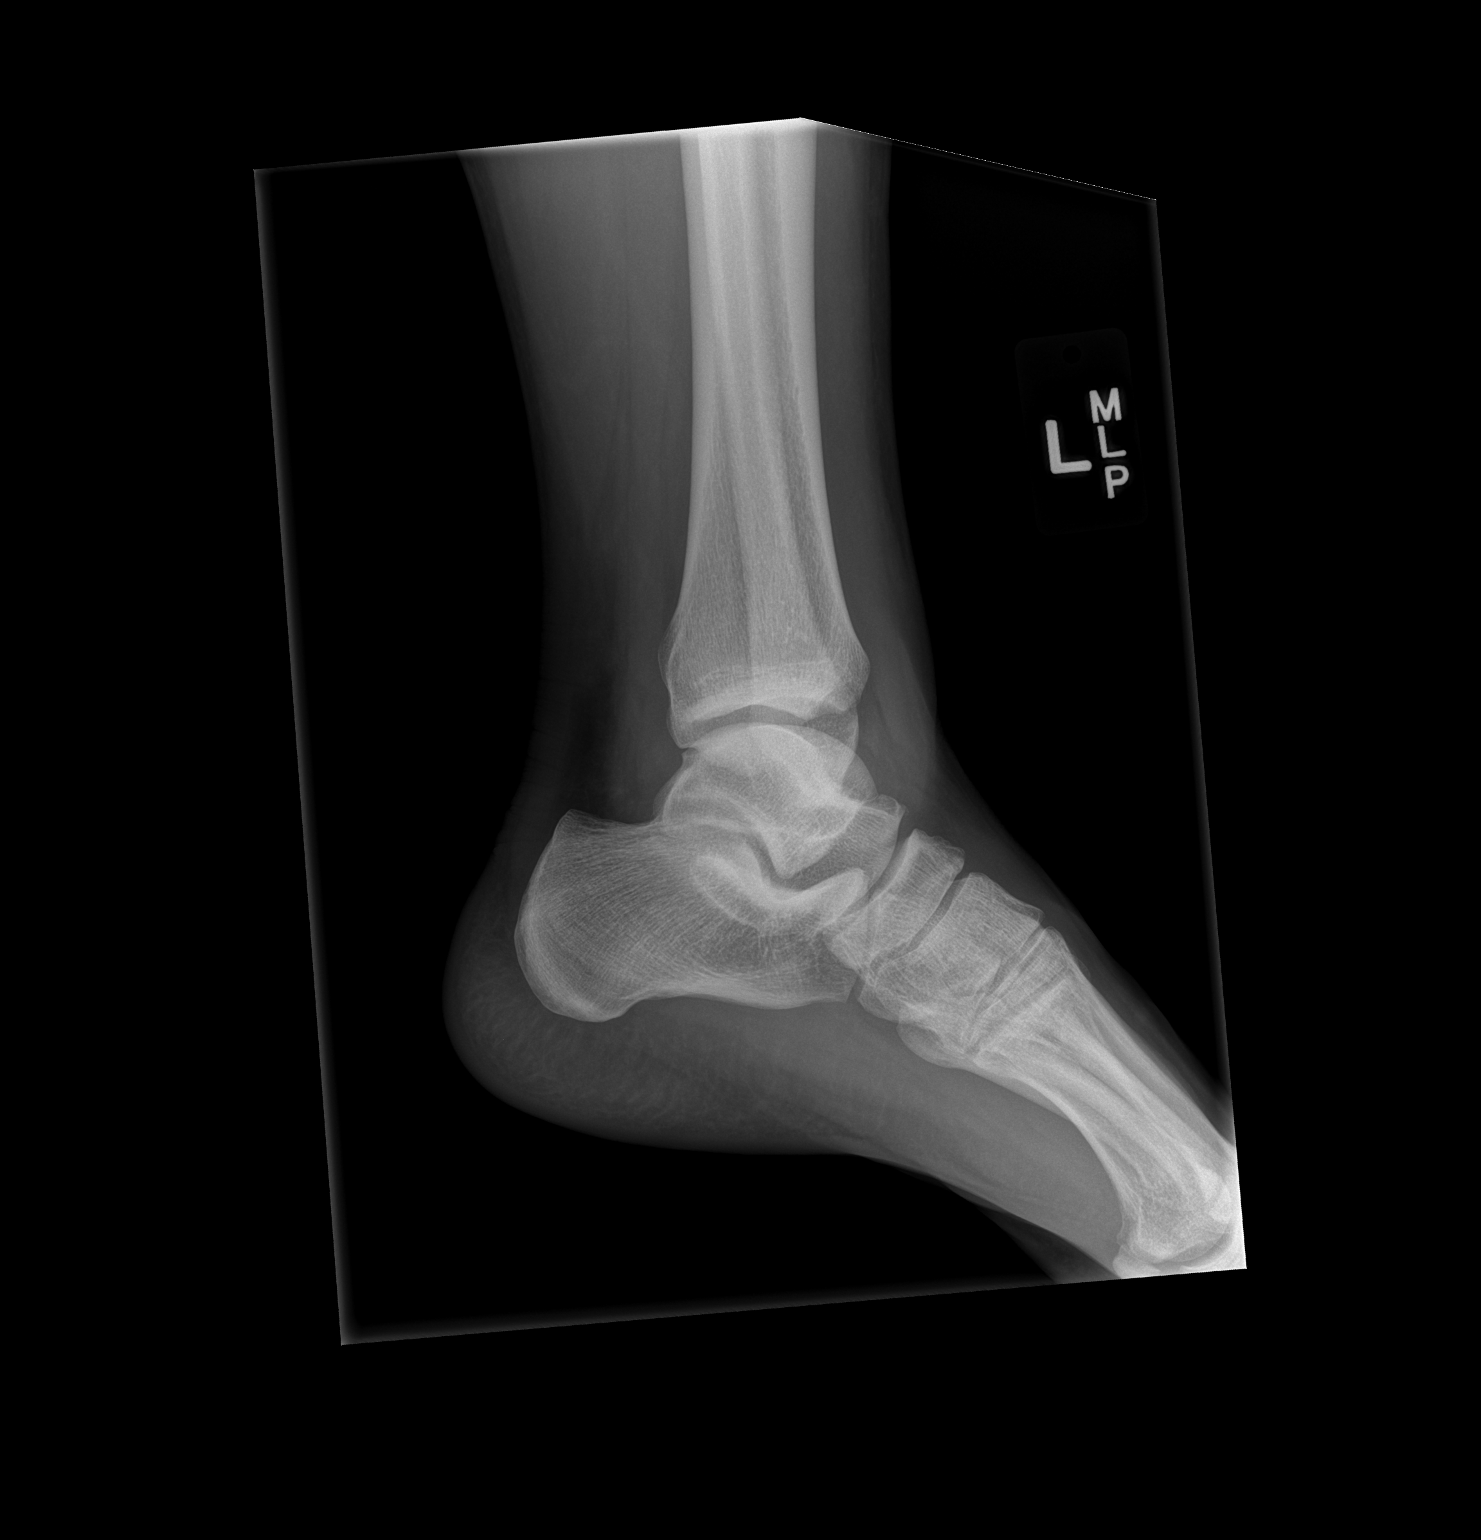

[3 of 3 positions shown; findings below may reference images not displayed]

FINDINGS: There is no evidence of fracture, dislocation, or joint effusion.
There is no evidence of arthropathy or other focal bone abnormality.
Soft tissues are unremarkable.
IMPRESSION: Negative.

## 2024-02-10 ENCOUNTER — Emergency Department (HOSPITAL_COMMUNITY)
Admission: EM | Admit: 2024-02-10 | Discharge: 2024-02-11 | Discharge status: Against Medical Advice | Payer: Self-pay | Attending: Emergency Medicine | Admitting: Emergency Medicine

## 2024-02-10 ENCOUNTER — Other Ambulatory Visit: Payer: Self-pay

## 2024-02-10 ENCOUNTER — Emergency Department (HOSPITAL_COMMUNITY): Payer: Self-pay

## 2024-02-10 DIAGNOSIS — Z5321 Procedure and treatment not carried out due to patient leaving prior to being seen by health care provider: Secondary | ICD-10-CM | POA: Insufficient documentation

## 2024-02-10 DIAGNOSIS — R0602 Shortness of breath: Secondary | ICD-10-CM | POA: Insufficient documentation

## 2024-02-10 DIAGNOSIS — R6883 Chills (without fever): Secondary | ICD-10-CM | POA: Insufficient documentation

## 2024-02-10 LAB — BASIC METABOLIC PANEL WITH GFR
Anion gap: 11 (ref 5–15)
BUN: 5 mg/dL — ABNORMAL LOW (ref 6–20)
CO2: 24 mmol/L (ref 22–32)
Calcium: 9.3 mg/dL (ref 8.9–10.3)
Chloride: 106 mmol/L (ref 98–111)
Creatinine, Ser: 0.67 mg/dL (ref 0.61–1.24)
GFR, Estimated: >60 mL/min (ref >60)
Glucose, Bld: 87 mg/dL (ref 70–99)
Potassium: 3.5 mmol/L (ref 3.5–5.1)
Sodium: 141 mmol/L (ref 135–145)

## 2024-02-10 LAB — CBC WITH DIFFERENTIAL/PLATELET
Abs Immature Granulocytes: 0.02 10*3/uL (ref 0.00–0.07)
Basophils Absolute: 0.1 10*3/uL (ref 0.0–0.1)
Basophils Relative: 1 %
Eosinophils Absolute: 0 10*3/uL (ref 0.0–0.5)
Eosinophils Relative: 0 %
HCT: 40.8 % (ref 39.0–52.0)
Hemoglobin: 13.8 g/dL (ref 13.0–17.0)
Immature Granulocytes: 0 %
Lymphocytes Relative: 19 %
Lymphs Abs: 1.3 10*3/uL (ref 0.7–4.0)
MCH: 32 pg (ref 26.0–34.0)
MCHC: 33.8 g/dL (ref 30.0–36.0)
MCV: 94.7 fL (ref 80.0–100.0)
Monocytes Absolute: 0.4 10*3/uL (ref 0.1–1.0)
Monocytes Relative: 5 %
Neutro Abs: 5.4 10*3/uL (ref 1.7–7.7)
Neutrophils Relative %: 75 %
Platelets: 261 10*3/uL (ref 150–400)
RBC: 4.31 MIL/uL (ref 4.22–5.81)
RDW: 12.1 % (ref 11.5–15.5)
WBC: 7.2 10*3/uL (ref 4.0–10.5)
nRBC: 0 % (ref 0.0–0.2)

## 2024-02-10 LAB — BRAIN NATRIURETIC PEPTIDE: B Natriuretic Peptide: 8.2 pg/mL (ref 0.0–100.0)

## 2024-02-10 NOTE — ED Notes (Signed)
 Pt called more than 3x no response, nurse was notified.

## 2024-02-10 NOTE — ED Notes (Signed)
Called pt x3 for vitals, no response. 

## 2024-02-10 NOTE — ED Notes (Signed)
 Pt. Called multiple times. NT during first shift said Pt. Would disappear hours on end while he was here.

## 2024-02-10 NOTE — ED Triage Notes (Signed)
 Wallee Spanish interpretor used to assist with triage. 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 Denies N/V, Reports chills.

## 2024-02-10 NOTE — ED Provider Triage Note (Signed)
 Emergency Medicine Provider Triage Evaluation Note  Nicholas Butler , a 38 y.o. male  was evaluated in triage.  Pt complains of SHOB.  Review of Systems  Positive: Chills, SHOB Negative: Fever, CP, N/V,  Physical Exam  BP (!) 133/92 (BP Location: Right Arm)   Pulse 66   Temp 98.2 F (36.8 C)   Resp (!) 21   SpO2 100%  Gen:   Awake, no distress   Resp:  Normal effort  MSK:   Moves extremities without difficulty  Other:    Medical Decision Making  Medically screening exam initiated at 2:34 PM.  Appropriate orders placed.  Nicholas Butler was informed that the remainder of the evaluation will be completed by another provider, this initial triage assessment does not replace that evaluation, and the importance of remaining in the ED until their evaluation is complete.  Labs and imaging ordered  Interpreter used   Carie Charity, [place] 02/10/24 1436
